# Patient Record
Sex: Female | Born: 2000 | Race: White | Hispanic: No | Marital: Single | Smoking: Never smoker
Health system: Southern US, Community
[De-identification: ages and names within clinical notes are randomized; demographics above are authoritative.]

## PROBLEM LIST (undated history)

## (undated) DIAGNOSIS — M21949 Unspecified acquired deformity of hand, unspecified hand: Secondary | ICD-10-CM

---
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
## 2020-02-13 ENCOUNTER — Ambulatory Visit
Admission: RE | Admit: 2020-02-13 | Discharge: 2020-02-13 | Disposition: A | Discharge status: Home / Self Care | Payer: BC Managed Care – PPO | Attending: Pediatrics | Admitting: Pediatrics

## 2020-02-13 ENCOUNTER — Other Ambulatory Visit: Payer: Self-pay

## 2020-02-13 ENCOUNTER — Other Ambulatory Visit: Payer: Self-pay | Admitting: Pediatrics

## 2020-02-13 ENCOUNTER — Ambulatory Visit
Admission: RE | Admit: 2020-02-13 | Discharge: 2020-02-13 | Disposition: A | Discharge status: Home / Self Care | Payer: BC Managed Care – PPO | Source: Ambulatory Visit | Attending: Pediatrics | Admitting: Pediatrics

## 2020-02-13 DIAGNOSIS — R079 Chest pain, unspecified: Secondary | ICD-10-CM | POA: Insufficient documentation

## 2020-02-20 ENCOUNTER — Other Ambulatory Visit: Payer: Self-pay

## 2020-02-20 ENCOUNTER — Ambulatory Visit
Admission: RE | Admit: 2020-02-20 | Discharge: 2020-02-20 | Disposition: A | Discharge status: Home / Self Care | Payer: BC Managed Care – PPO | Source: Ambulatory Visit | Attending: Pediatrics | Admitting: Pediatrics

## 2020-02-20 DIAGNOSIS — R079 Chest pain, unspecified: Secondary | ICD-10-CM | POA: Diagnosis present

## 2021-05-23 IMAGING — CR DG CHEST 2V
2 series · 2 of 2 positions shown · non-contrast
Comparison: None.

CLINICAL DATA: 19-year-old female with chest pain

EXAM:
CHEST - 2 VIEW

[chest pa]
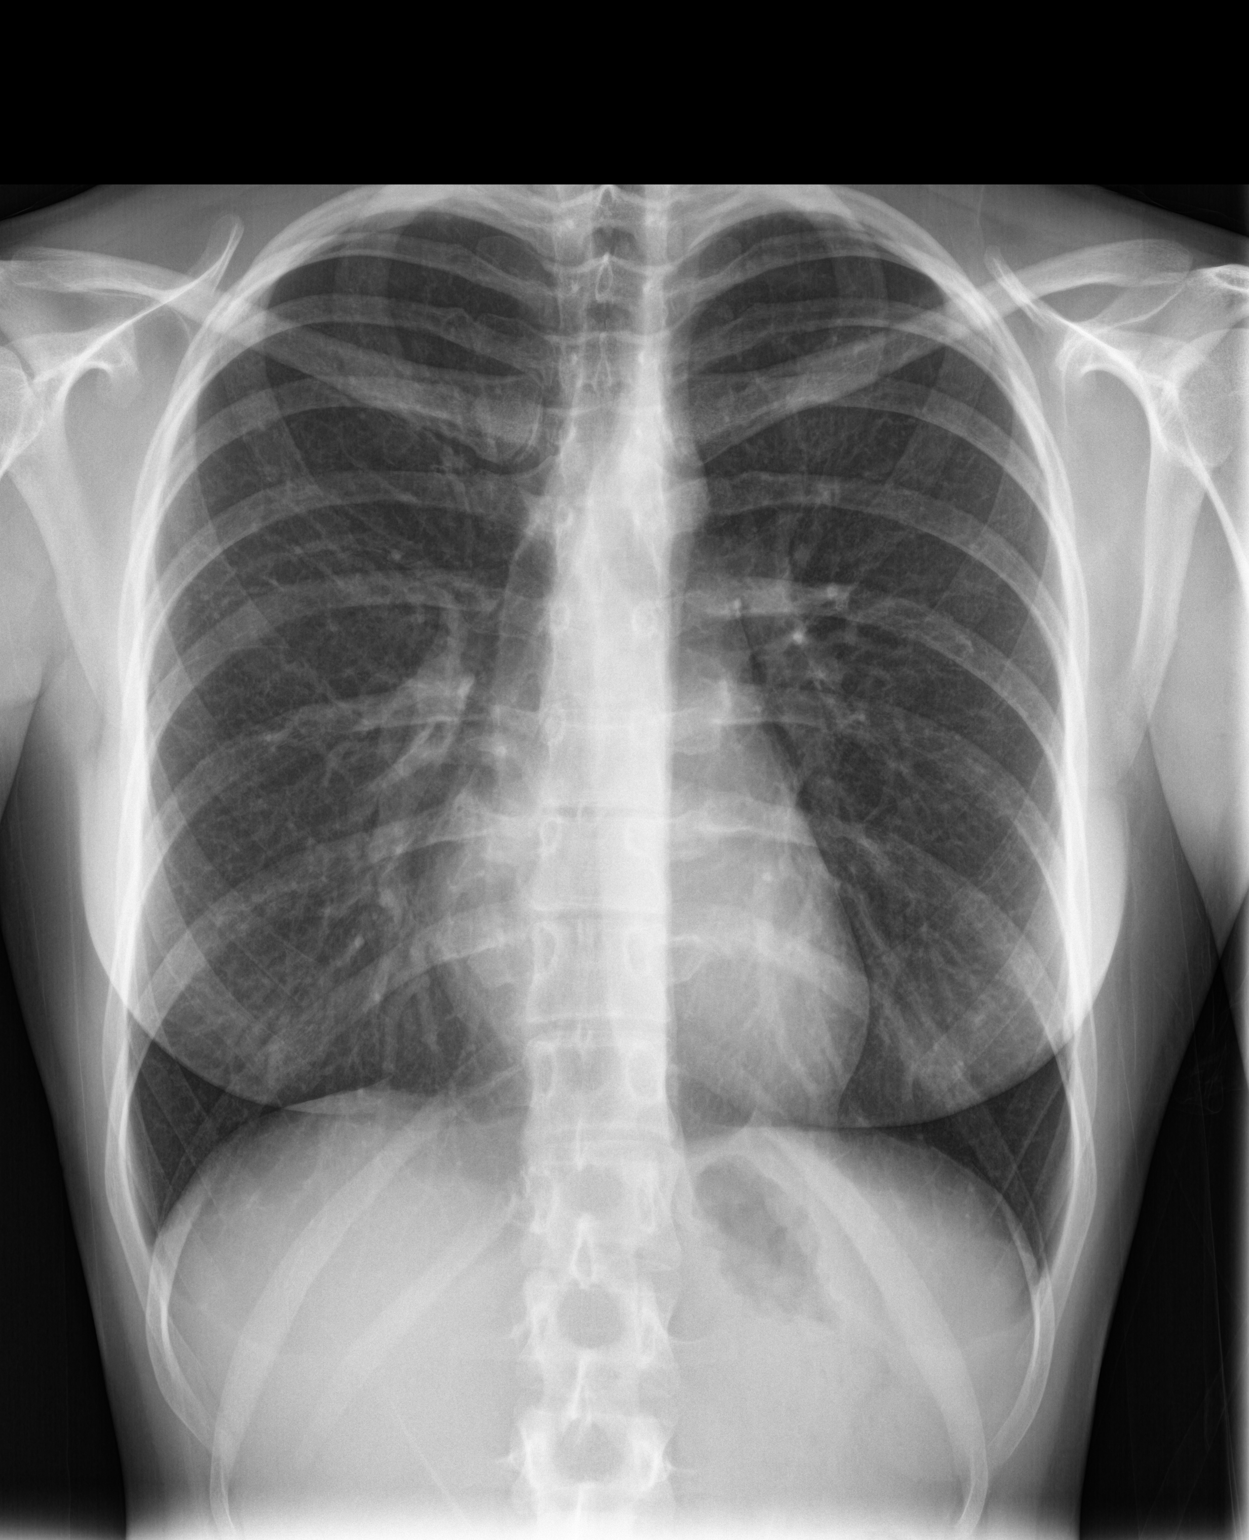

[chest lat]
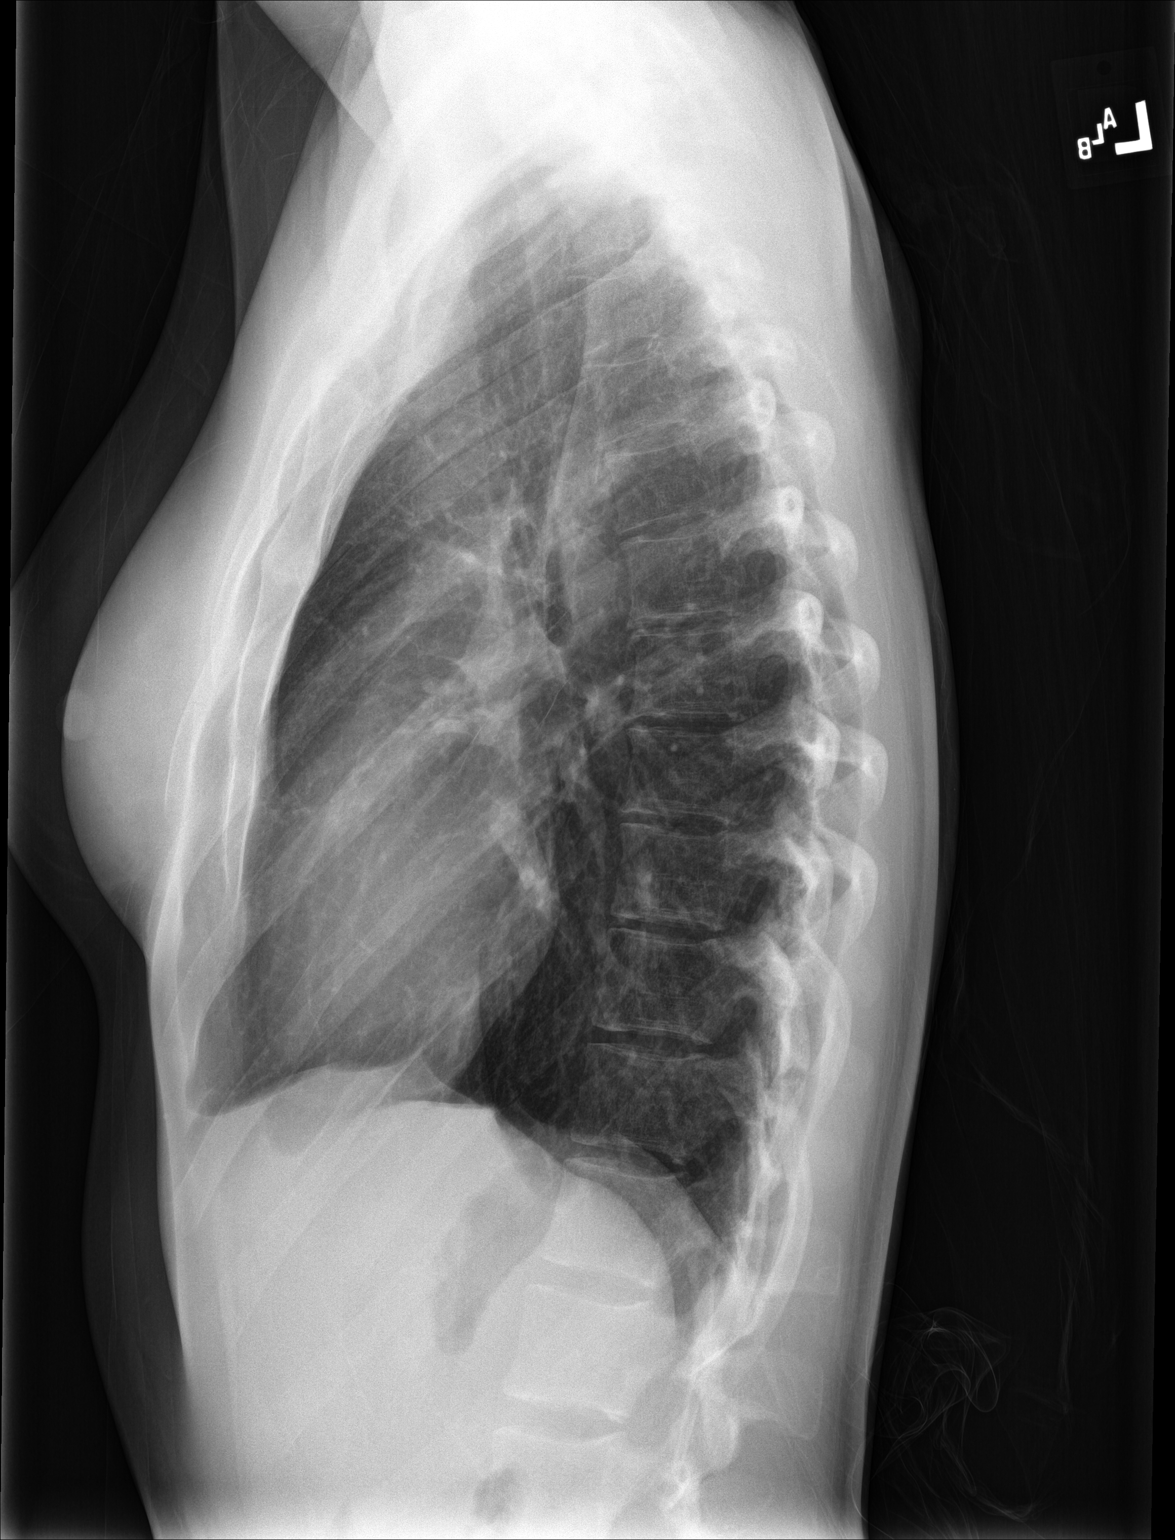

[2 of 2 positions shown; findings below may reference images not displayed]

FINDINGS: The heart size and mediastinal contours are within normal limits.
Both lungs are clear. The visualized skeletal structures are
unremarkable.
IMPRESSION: Negative for acute cardiopulmonary disease

## 2021-11-04 ENCOUNTER — Other Ambulatory Visit: Payer: Self-pay

## 2021-11-04 ENCOUNTER — Ambulatory Visit
Admission: EM | Admit: 2021-11-04 | Discharge: 2021-11-04 | Disposition: A | Discharge status: Home / Self Care | Payer: Managed Care, Other (non HMO)

## 2021-11-04 ENCOUNTER — Encounter: Payer: Self-pay | Admitting: Emergency Medicine

## 2021-11-04 DIAGNOSIS — L519 Erythema multiforme, unspecified: Secondary | ICD-10-CM

## 2021-11-04 DIAGNOSIS — J069 Acute upper respiratory infection, unspecified: Secondary | ICD-10-CM

## 2021-11-04 MED ORDER — BENZONATATE 100 MG PO CAPS: 200.0000 mg | ORAL_CAPSULE | Freq: Three times a day (TID) | ORAL | 0 refills | Status: AC

## 2021-11-04 MED ORDER — PROMETHAZINE-DM 6.25-15 MG/5ML PO SYRP: 5.0000 mL | ORAL_SOLUTION | Freq: Four times a day (QID) | ORAL | 0 refills | Status: AC | PRN

## 2021-11-04 MED ORDER — IPRATROPIUM BROMIDE 0.06 % NA SOLN: 2.0000 | Freq: Four times a day (QID) | NASAL | 12 refills | Status: AC

## 2021-11-04 MED ORDER — TRIAMCINOLONE ACETONIDE 0.1 % EX CREA: 1.0000 | TOPICAL_CREAM | Freq: Two times a day (BID) | CUTANEOUS | 0 refills | Status: AC

## 2021-11-04 NOTE — Discharge Instructions (Signed)
Use the triamcinolone cream on yrou rash twice daily as needed for itching.  Take OTC Zyrtec, Claritin, or Allegra during the day as needed for itching and use Benadryl at bedtime.  For the remainder of your upper respiratory symptoms please use the following as needed.  Use the Atrovent nasal spray, 2 squirts in each nostril every 6 hours, as needed for runny nose and postnasal drip.  Use the Tessalon Perles every 8 hours during the day.  Take them with a small sip of water.  They may give you some numbness to the base of your tongue or a metallic taste in your mouth, this is normal.  Use the Promethazine DM cough syrup at bedtime for cough and congestion.  It will make you drowsy so do not take it during the day.  Return for reevaluation or see your primary care provider for any new or worsening symptoms.

## 2021-11-04 NOTE — ED Triage Notes (Signed)
Rash started yesterday, initially found on right thigh.  Now rash is allover body.  URI symptoms started last Tuesday.  Fever Wednesday or Thursday, this was followed by cough and congestion.    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  Reports she has had rashes secondary to stress, not sure if this is the case.

## 2021-11-04 NOTE — ED Provider Notes (Signed)
[facility]    CSN: [id number] Arrival date & time: 11/04/21  1125      History   Chief Complaint Chief Complaint  Patient presents with   Rash    HPI Pamela Terrell is a 21 y.o. female.   HPI  21 year old female here for evaluation of body rash.  Patient reports that she noticed a rash developing yesterday as a single lesion on her right thigh and now the rash is on both of her lower extremities front and back and she has a lesion on the left side of her face.  Her anterior and posterior torso and arms are spared.  She is also endorsing upper respiratory symptoms that began 6 days ago to include runny nose, nasal congestion, a sore throat last Wednesday, ear pressure, fever last Thursday, and a cough that is both productive and nonproductive.  The rash is targetoid in nature, erythematous, and only itches when she gets hot.  Patient denies any swelling of her lips or tongue, tightness in throat, or shortness of breath.  She is able to eat and drink without issue.  Past Medical History:  Diagnosis Date   Hand deformity     There are no problems to display for this patient.   History reviewed. No pertinent surgical history.  OB History   No obstetric history on file.      Home Medications    Prior to Admission medications   Medication Sig Start Date End Date Taking? Authorizing Provider  aspirin-sod bicarb-citric acid (ALKA-SELTZER) 325 MG TBEF tablet Take 325 mg by mouth every 6 (six) hours as needed.   Yes [provider]  benzonatate (TESSALON) 100 MG capsule Take 2 capsules (200 mg total) by mouth every 8 (eight) hours. 11/04/21  Yes Margarette Canada, NP  ipratropium (ATROVENT) 0.06 % nasal spray Place 2 sprays into both nostrils 4 (four) times daily. 11/04/21  Yes Margarette Canada, NP  promethazine-dextromethorphan (PROMETHAZINE-DM) 6.25-15 MG/5ML syrup Take 5 mLs by mouth 4 (four) times daily as needed. 11/04/21  Yes Margarette Canada, NP  triamcinolone cream  (KENALOG) 0.1 % Apply 1 Application topically 2 (two) times daily. 11/04/21  Yes Margarette Canada, NP    Family History Family History  Problem Relation Age of Onset   Healthy Mother     Social History Social History   Tobacco Use   Smoking status: Never   Smokeless tobacco: Never  Vaping Use   Vaping Use: Never used  Substance Use Topics   Alcohol use: No   Drug use: No     Allergies   Patient has no known allergies.   Review of Systems Review of Systems  Constitutional:  Positive for fever.  HENT:  Positive for congestion, ear pain, rhinorrhea and sore throat.   Respiratory:  Positive for cough. Negative for shortness of breath and wheezing.   Skin:  Positive for rash.  Hematological: Negative.   Psychiatric/Behavioral: Negative.       Physical Exam Triage Vital Signs ED Triage Vitals  Enc Vitals Group     BP 11/04/21 1204 103/75     Pulse Rate 11/04/21 1204 85     Resp 11/04/21 1204 16     Temp 11/04/21 1204 98.5 F (36.9 C)     Temp Source 11/04/21 1204 Oral     SpO2 11/04/21 1204 99 %     Weight --      Height --      Head Circumference --  Peak Flow --      Pain Score 11/04/21 1202 0     Pain Loc --      Pain Edu? --      Excl. in [place]? --    No data found.  Updated Vital Signs BP 103/75 (BP Location: Left Arm)   Pulse 85   Temp 98.5 F (36.9 C) (Oral)   Resp 16   LMP 10/26/2021   SpO2 99%   Visual Acuity Right Eye Distance:   Left Eye Distance:   Bilateral Distance:    Right Eye Near:   Left Eye Near:    Bilateral Near:     Physical Exam Vitals and nursing note reviewed.  Constitutional:      Appearance: Normal appearance. She is not ill-appearing.  HENT:     Head: Normocephalic and atraumatic.     Right Ear: Tympanic membrane, ear canal and external ear normal. There is no impacted cerumen.     Left Ear: Tympanic membrane, ear canal and external ear normal. There is no impacted cerumen.     Nose: Congestion and rhinorrhea  present.     Mouth/Throat:     Mouth: Mucous membranes are moist.     Pharynx: Oropharynx is clear. Posterior oropharyngeal erythema present. No oropharyngeal exudate.  Cardiovascular:     Rate and Rhythm: Normal rate and regular rhythm.     Pulses: Normal pulses.     Heart sounds: Normal heart sounds. No murmur heard.    No friction rub. No gallop.  Pulmonary:     Effort: Pulmonary effort is normal.     Breath sounds: Normal breath sounds. No wheezing, rhonchi or rales.  Musculoskeletal:     Cervical back: Normal range of motion and neck supple.  Lymphadenopathy:     Cervical: No cervical adenopathy.  Skin:    General: Skin is warm and dry.     Capillary Refill: Capillary refill takes less than 2 seconds.     Findings: Erythema and rash present.  Neurological:     General: No focal deficit present.     Mental Status: She is alert and oriented to person, place, and time.  Psychiatric:        Mood and Affect: Mood normal.        Thought Content: Thought content normal.        Judgment: Judgment normal.      UC Treatments / Results  Labs (all labs ordered are listed, but only abnormal results are displayed) Labs Reviewed - No data to display  EKG   Radiology No results found.  Procedures Procedures (including critical care time)  Medications Ordered in UC Medications - No data to display  Initial Impression / Assessment and Plan / UC Course  I have reviewed the triage vital signs and the nursing notes.  Pertinent labs & imaging results that were available during my care of the patient were reviewed by me and considered in my medical decision making (see chart for details).   Patient is a very pleasant, nontoxic-appearing 21 year old female here for evaluation of body rash and respiratory symptoms.  The respiratory symptoms began 6 days ago and the body rash began last night.  The body rash consists of erythematous targetoid lesions that are blanchable on the  anterior and posterior legs bilaterally.          There is also a single lesion on the left cheek.  The rash is minimally raised.  There are no mucosal lesions  noted in either the eyes or mouth.  Patient states she is eating and drinking normally.  The rest the patient's respiratory exam reveals erythematous and edematous nasal mucosa with clear discharge in both nares.  Patient does have some very mild posterior oropharyngeal erythema and clear postnasal drip.  No cervical lymphadenopathy appreciable exam.  Cardiopulmonary exam reveals S1-S2 heart sounds with regular rate and rhythm and lung sounds are clear to auscultation all fields.  Patient's exam is consistent with an upper respiratory infection, most likely viral.  Since her symptoms began 6 days ago I do not feel the need to swab her for COVID as she is outside both the treatment and quarantine windows.  I do believe the rash is erythema multiforme.  Given that she has no mucosal involvement I will treat her symptomatically with antihistamines and topical steroids as needed for itching.  I will treat her respiratory symptoms with Atrovent nasal spray, Tessalon Perles, and Promethazine DM cough syrup.  I have advised the patient that if her symptoms worsen she needs to do seek care in the emergency department or at student health when she returns to school tomorrow.   Final Clinical Impressions(s) / UC Diagnoses   Final diagnoses:  Erythema multiforme  Viral URI with cough     Discharge Instructions      Use the triamcinolone cream on yrou rash twice daily as needed for itching.  Take OTC Zyrtec, Claritin, or Allegra during the day as needed for itching and use Benadryl at bedtime.  For the remainder of your upper respiratory symptoms please use the following as needed.  Use the Atrovent nasal spray, 2 squirts in each nostril every 6 hours, as needed for runny nose and postnasal drip.  Use the Tessalon Perles every 8 hours  during the day.  Take them with a small sip of water.  They may give you some numbness to the base of your tongue or a metallic taste in your mouth, this is normal.  Use the Promethazine DM cough syrup at bedtime for cough and congestion.  It will make you drowsy so do not take it during the day.  Return for reevaluation or see your primary care provider for any new or worsening symptoms.      ED Prescriptions     Medication Sig Dispense Auth. Provider   benzonatate (TESSALON) 100 MG capsule Take 2 capsules (200 mg total) by mouth every 8 (eight) hours. 21 capsule Margarette Canada, NP   ipratropium (ATROVENT) 0.06 % nasal spray Place 2 sprays into both nostrils 4 (four) times daily. 15 mL Margarette Canada, NP   promethazine-dextromethorphan (PROMETHAZINE-DM) 6.25-15 MG/5ML syrup Take 5 mLs by mouth 4 (four) times daily as needed. 118 mL Margarette Canada, NP   triamcinolone cream (KENALOG) 0.1 % Apply 1 Application topically 2 (two) times daily. 30 g Margarette Canada, NP      PDMP not reviewed this encounter.   Margarette Canada, NP 11/04/21 1254
